# Patient Record
Sex: Female | Born: 1985 | Race: Black or African American | Hispanic: No | Marital: Single | Smoking: Former smoker
Health system: Southern US, Community
[De-identification: ages and names within clinical notes are randomized; demographics above are authoritative.]

## PROBLEM LIST (undated history)

## (undated) DIAGNOSIS — D649 Anemia, unspecified: Secondary | ICD-10-CM

## (undated) HISTORY — PX: LEEP: SHX91

---

## 2008-02-04 ENCOUNTER — Ambulatory Visit: Payer: Self-pay | Admitting: Unknown Physician Specialty

## 2010-08-04 ENCOUNTER — Emergency Department (HOSPITAL_BASED_OUTPATIENT_CLINIC_OR_DEPARTMENT_OTHER): Admission: EM | Admit: 2010-08-04 | Discharge: 2010-08-04 | Payer: Self-pay | Admitting: Emergency Medicine

## 2013-09-02 NOTE — L&D Delivery Note (Signed)
0230: In room to assess patient after noting variable decelerations with contractions.  Patient breathing and coping well through contractions.  SVE: 8-9/100/+2.  Patient quickly progressed to complete dilation.  Pushing spontaneously with contractions.  Encouraged by staff and FOB.  Patient delivered as below with NICU team present for IUGR.  Delivery Note At 2:50 AM, on Aug 03, 2014, a viable female "Darlene Shepard"  was delivered via Vaginal, Spontaneous Delivery (Presentation: ; Left Occiput Transverse with restitution to Direct OP).  Infant noted to have nuchal cord, after delivery of head, which she was delivered through via somersault maneuver.  Infant with vigorous cry and good tone and immediately placed upon mother's abdomen. Cord clamped and cut, then infant taken by nurse to warmer for NICU team evaluation.  Infant APGARs: 8, 9.  Cord blood collected. Placenta delivered spontaneously and noted to be intact with 3VC upon inspection. Sent to pathology due to IUGR.  Vaginal inspection revealed no lacerations.  Fundus firm, at the umbilicus, and bleeding scant.  Mother hemodynamically stable and infant skin to skin prior to provider exit.  Mother unsure of birth control method and opts to breastfeed.  Infant weight at one hour of life: 5 lb 11.4 oz (2590 g).  Anesthesia: None  Episiotomy: None Lacerations: None Suture Repair: N/A Est. Blood Loss (mL): 100  Mom to postpartum.  Baby to Couplet care / Skin to Skin.  Tafari Humiston Larita FifeLYNN, MSN, CNM 08/03/2014, 3:16 AM

## 2013-12-07 LAB — OB RESULTS CONSOLE RUBELLA ANTIBODY, IGM: RUBELLA: IMMUNE

## 2013-12-07 LAB — OB RESULTS CONSOLE GC/CHLAMYDIA
CHLAMYDIA, DNA PROBE: NEGATIVE
GC PROBE AMP, GENITAL: NEGATIVE

## 2013-12-07 LAB — OB RESULTS CONSOLE HIV ANTIBODY (ROUTINE TESTING): HIV: NONREACTIVE

## 2013-12-07 LAB — OB RESULTS CONSOLE ABO/RH: RH Type: POSITIVE

## 2013-12-07 LAB — OB RESULTS CONSOLE HEPATITIS B SURFACE ANTIGEN: Hepatitis B Surface Ag: NEGATIVE

## 2013-12-07 LAB — OB RESULTS CONSOLE ANTIBODY SCREEN: ANTIBODY SCREEN: NEGATIVE

## 2013-12-07 LAB — OB RESULTS CONSOLE RPR: RPR: NONREACTIVE

## 2014-01-13 ENCOUNTER — Other Ambulatory Visit: Payer: Self-pay

## 2014-03-18 ENCOUNTER — Other Ambulatory Visit (HOSPITAL_COMMUNITY): Payer: Self-pay | Admitting: Obstetrics and Gynecology

## 2014-03-18 DIAGNOSIS — IMO0002 Reserved for concepts with insufficient information to code with codable children: Secondary | ICD-10-CM

## 2014-03-29 ENCOUNTER — Ambulatory Visit (HOSPITAL_COMMUNITY)
Admission: RE | Admit: 2014-03-29 | Discharge: 2014-03-29 | Disposition: A | Discharge status: Home / Self Care | Payer: 59 | Source: Ambulatory Visit | Attending: Obstetrics and Gynecology | Admitting: Obstetrics and Gynecology

## 2014-03-29 ENCOUNTER — Encounter (HOSPITAL_COMMUNITY): Payer: Self-pay

## 2014-03-29 ENCOUNTER — Other Ambulatory Visit (HOSPITAL_COMMUNITY): Payer: Self-pay | Admitting: Obstetrics and Gynecology

## 2014-03-29 VITALS — BP 119/83 | HR 77 | Wt 156.2 lb

## 2014-03-29 DIAGNOSIS — IMO0002 Reserved for concepts with insufficient information to code with codable children: Secondary | ICD-10-CM

## 2014-03-29 DIAGNOSIS — Z3689 Encounter for other specified antenatal screening: Secondary | ICD-10-CM | POA: Insufficient documentation

## 2014-03-29 DIAGNOSIS — O365990 Maternal care for other known or suspected poor fetal growth, unspecified trimester, not applicable or unspecified: Secondary | ICD-10-CM | POA: Diagnosis not present

## 2014-03-29 NOTE — Consult Note (Signed)
MFM consult  28 yr old G3P1011 at 48w3dby LMP consistent with 12 week ultrasound referred for fetal anatomic survey and consult. Fetus with lagging growth on outside ultrasound.  Ultrasound today shows: single intrauterine pregnancy. Estimated fetal weight is in the 15th%; the abdominal circumference is in the <3rd%. Anterior placenta without evidence of previa. Normal amniotic fluid volume. Normal transabdominal cervical length. The views of the 4 chamber and aortic and ductal arches are limited. There is right sided fetal urinary tract dilation (class I); the right renal pelvis measures 4.738m the left renal pelvis, ureters, and bladder appear normal. The remainder of the limited anatomy survey is normal.   I counseled the patient as follows:  1. Lagging fetal growth: - dated by LMP consistent with 12 week ultrasound - discussed possible etiologies- placental insufficiency, aneuploidy, other genetic conditions, infections, constitutional (patient's first child was 6lbs 10oz at 41 weeks) - discussed it is reassuring that the amniotic fluid volume and Doppler studies are normal today - discussed increased risk of stillbirth, needing hospitalization, and possible need for preterm delivery - would recommend a course of betamethasone if Doppler studies become abnormal or other clinical indication - recommend weekly Doppler studies - recommend fetal growth in 3 weeks 2. Urinary tract dilation: discussed slight association with fetal aneuploidy; specifically trisomy 21. . Marland Kitcheniscussed the etiology could be benign finding or variant of normal- this is most likely given the minimal dilation. Pyelectasis can also be caused by obstruction or vesicoureteral reflux. I discussed that the majority of the cases resolve antepartum or shortly after delivery. A small percentage of cases may require prophylactic antibiotics, renal ultrasound, and VCUG, or surgical correction. Will reevaluate fetal kidneys on follow up  ultrasounds 3. Patient met with the genetic counselor; see separate report - after counseling patient declined aneuploidy screening/testing   I spent a total of 45 minutes with the patient of which >50% was in face to face consultation.  KrElam CityMD

## 2014-03-29 NOTE — Progress Notes (Signed)
Genetic Counseling  High-Risk Gestation Note  Appointment Date:  03/29/2014 Referred By: Kirkland Hun, MD Date of Birth:  July 19, 1986    Pregnancy History: G3P0011 Estimated Date of Delivery: 07/23/14 Estimated Gestational Age: [redacted]w[redacted]d Attending: Eulis Foster, MD  Darlene Shepard and her partner were seen for genetic counseling because of abnormal ultrasound findings.    Targeted ultrasound was performed today and visualized lagging fetal growth and right sided urinary tract dilation (pyelectasis) measuring 4.7 mm. Complete ultrasound results reported separately.   We discussed that the second trimester genetic sonogram is targeted at identifying features associated with aneuploidy.  It has evolved as a screening tool used to provide an individualized risk assessment for Down syndrome and other trisomies.  The ability of sonography to aid in the detection of aneuploidies relies on identification of both major structural anomalies and "soft markers."  The patient was counseled that the latter term refers to findings that are often normal variants and do not cause any significant medical problems.  Nonetheless, these markers have a known association with aneuploidy.    We discussed that urinary tract dilation (also referred to as pyelectasis) is identified when the antero-posterior diameter of either renal pelvis exceeds > 4 mm between 20 and 30 weeks.  We reviewed that this marker is more common in males and is considered a benign variant in fetuses who are determined to have no other risk factors for fetal aneuploidy.  Since this finding can progress to congenital hydronephrosis, follow up ultrasounds are recommended.  Ms. Facchini is scheduled to return for a follow-up ultrasound to assess fetal growth on 04/19/14. Weekly dopplers were planned given the lagging growth identified on ultrasound.   We reviewed chromosomes, nondisjunction, and the common features and variable prognosis  of Down syndrome.  We also reviewed other aneuploidies including trisomies 13 and 14; however, we discussed that these conditions are less likely given that the remainder of the fetal anatomy was wnl by ultrasound.  We reviewed that Ms. Walsh had first trimester screening, which were within normal range, reducing the risks for fetal Down syndrome (< 1 in 10,000) and trisomy 18/13 (< 1 in 10,000). We reviewed that screening adjusts the a priori risk for these conditions but does not diagnose or rule out these conditions. Additionally, screening does not assess for all chromosome or genetic conditions. Considering the combined ultrasound finding of pyelectasis and lagging fetal growth, the risk for fetal aneuploidy would be increased. We discussed that the adjusted risk for fetal Down syndrome would be likely less than 1 in 1,000.   Regarding lagging fetal growth, the patient reported that her son, with a previous partner, was 6 lbs 10 oz at birth and was a term delivery. We reviewed that there can be multiple causes for fetal growth lag including fetal aneuploidy, less common genetic conditions, placental etiology, and constitutional. See separate ultrasound report for additional discussion.    We reviewed other available screening and diagnostic options including noninvasive prenatal screening (NIPS)/cell free DNA (cfDNA) testing, and amniocentesis.  She was counseled regarding the benefits and limitations of each option.  We reviewed the approximate 1 in 300-500 risk for complications for amniocentesis, including spontaneous pregnancy loss or preterm labor at this gestational age. After consideration of all the options, she declined NIPS and amniocentesis at this time.   Both family histories were reviewed and found to be noncontributory for birth defects, intellectual disability, and known genetic conditions. Without further information regarding the provided family history, an  accurate genetic risk  cannot be calculated. Further genetic counseling is warranted if more information is obtained.  Ms. Tesslyn T Zinda denied exposure to environmental toxins or chemical agents. She denied the use of alcohol, tobacco or street drugs. She denied significant viral illnesses during the course of her pregnancy. Her medical and surgical histories were noncontributory.   I counseled this couple regarding the above risks and available options.  The approximate face-to-face time with the genetic counselor was 20 minutes.  Quinn PlowmanKaren Banks Chaikin, MS Certified Genetic Counselor 03/29/2014

## 2014-04-05 ENCOUNTER — Ambulatory Visit (HOSPITAL_COMMUNITY)
Admission: RE | Admit: 2014-04-05 | Discharge: 2014-04-05 | Disposition: A | Discharge status: Home / Self Care | Payer: 59 | Source: Ambulatory Visit | Attending: Obstetrics and Gynecology | Admitting: Obstetrics and Gynecology

## 2014-04-05 DIAGNOSIS — IMO0002 Reserved for concepts with insufficient information to code with codable children: Secondary | ICD-10-CM

## 2014-04-05 DIAGNOSIS — O365990 Maternal care for other known or suspected poor fetal growth, unspecified trimester, not applicable or unspecified: Secondary | ICD-10-CM | POA: Diagnosis present

## 2014-04-05 DIAGNOSIS — Z3689 Encounter for other specified antenatal screening: Secondary | ICD-10-CM | POA: Diagnosis not present

## 2014-04-12 ENCOUNTER — Encounter (HOSPITAL_COMMUNITY): Payer: Self-pay

## 2014-04-12 ENCOUNTER — Ambulatory Visit (HOSPITAL_COMMUNITY)
Admission: RE | Admit: 2014-04-12 | Discharge: 2014-04-12 | Disposition: A | Discharge status: Home / Self Care | Payer: 59 | Source: Ambulatory Visit | Attending: Obstetrics and Gynecology | Admitting: Obstetrics and Gynecology

## 2014-04-12 VITALS — BP 115/55 | HR 85 | Wt 162.2 lb

## 2014-04-12 DIAGNOSIS — Z3689 Encounter for other specified antenatal screening: Secondary | ICD-10-CM | POA: Insufficient documentation

## 2014-04-12 DIAGNOSIS — IMO0002 Reserved for concepts with insufficient information to code with codable children: Secondary | ICD-10-CM

## 2014-04-12 DIAGNOSIS — O365920 Maternal care for other known or suspected poor fetal growth, second trimester, not applicable or unspecified: Secondary | ICD-10-CM

## 2014-04-12 DIAGNOSIS — O365990 Maternal care for other known or suspected poor fetal growth, unspecified trimester, not applicable or unspecified: Secondary | ICD-10-CM | POA: Diagnosis present

## 2014-04-12 DIAGNOSIS — O358XX0 Maternal care for other (suspected) fetal abnormality and damage, not applicable or unspecified: Secondary | ICD-10-CM | POA: Insufficient documentation

## 2014-04-19 ENCOUNTER — Encounter (HOSPITAL_COMMUNITY): Payer: Self-pay

## 2014-04-19 ENCOUNTER — Ambulatory Visit (HOSPITAL_COMMUNITY)
Admission: RE | Admit: 2014-04-19 | Discharge: 2014-04-19 | Disposition: A | Discharge status: Home / Self Care | Payer: 59 | Source: Ambulatory Visit | Attending: Obstetrics and Gynecology | Admitting: Obstetrics and Gynecology

## 2014-04-19 VITALS — BP 116/64 | HR 89 | Wt 166.0 lb

## 2014-04-19 DIAGNOSIS — O365920 Maternal care for other known or suspected poor fetal growth, second trimester, not applicable or unspecified: Secondary | ICD-10-CM

## 2014-04-19 DIAGNOSIS — O365990 Maternal care for other known or suspected poor fetal growth, unspecified trimester, not applicable or unspecified: Secondary | ICD-10-CM | POA: Insufficient documentation

## 2014-04-19 DIAGNOSIS — Z3689 Encounter for other specified antenatal screening: Secondary | ICD-10-CM | POA: Insufficient documentation

## 2014-04-19 DIAGNOSIS — IMO0002 Reserved for concepts with insufficient information to code with codable children: Secondary | ICD-10-CM

## 2014-05-10 ENCOUNTER — Ambulatory Visit (HOSPITAL_COMMUNITY): Payer: 59 | Attending: Obstetrics and Gynecology

## 2014-05-20 ENCOUNTER — Other Ambulatory Visit (HOSPITAL_COMMUNITY): Payer: Self-pay | Admitting: Obstetrics and Gynecology

## 2014-05-20 DIAGNOSIS — O365931 Maternal care for other known or suspected poor fetal growth, third trimester, fetus 1: Secondary | ICD-10-CM

## 2014-05-20 DIAGNOSIS — O358XX0 Maternal care for other (suspected) fetal abnormality and damage, not applicable or unspecified: Secondary | ICD-10-CM

## 2014-05-25 ENCOUNTER — Ambulatory Visit (HOSPITAL_COMMUNITY)
Admission: RE | Admit: 2014-05-25 | Discharge: 2014-05-25 | Disposition: A | Discharge status: Home / Self Care | Payer: 59 | Source: Ambulatory Visit | Attending: Obstetrics and Gynecology | Admitting: Obstetrics and Gynecology

## 2014-05-25 ENCOUNTER — Other Ambulatory Visit (HOSPITAL_COMMUNITY): Payer: Self-pay | Admitting: Obstetrics and Gynecology

## 2014-05-25 VITALS — BP 118/64 | HR 84 | Wt 171.5 lb

## 2014-05-25 DIAGNOSIS — O365990 Maternal care for other known or suspected poor fetal growth, unspecified trimester, not applicable or unspecified: Secondary | ICD-10-CM | POA: Diagnosis not present

## 2014-05-25 DIAGNOSIS — O365931 Maternal care for other known or suspected poor fetal growth, third trimester, fetus 1: Secondary | ICD-10-CM

## 2014-05-25 DIAGNOSIS — O358XX0 Maternal care for other (suspected) fetal abnormality and damage, not applicable or unspecified: Secondary | ICD-10-CM | POA: Diagnosis present

## 2014-05-25 DIAGNOSIS — O365930 Maternal care for other known or suspected poor fetal growth, third trimester, not applicable or unspecified: Secondary | ICD-10-CM

## 2014-07-04 ENCOUNTER — Encounter (HOSPITAL_COMMUNITY): Payer: Self-pay

## 2014-07-28 ENCOUNTER — Inpatient Hospital Stay (HOSPITAL_COMMUNITY): Payer: 59

## 2014-07-28 ENCOUNTER — Encounter (HOSPITAL_COMMUNITY): Payer: Self-pay | Admitting: *Deleted

## 2014-07-28 ENCOUNTER — Inpatient Hospital Stay (HOSPITAL_COMMUNITY)
Admission: AD | Admit: 2014-07-28 | Discharge: 2014-07-29 | Disposition: A | Discharge status: Home / Self Care | Payer: 59 | Source: Ambulatory Visit | Attending: Obstetrics and Gynecology | Admitting: Obstetrics and Gynecology

## 2014-07-28 DIAGNOSIS — O368130 Decreased fetal movements, third trimester, not applicable or unspecified: Secondary | ICD-10-CM | POA: Insufficient documentation

## 2014-07-28 DIAGNOSIS — Z3A40 40 weeks gestation of pregnancy: Secondary | ICD-10-CM | POA: Diagnosis not present

## 2014-07-28 DIAGNOSIS — O480 Post-term pregnancy: Secondary | ICD-10-CM | POA: Insufficient documentation

## 2014-07-28 DIAGNOSIS — O368190 Decreased fetal movements, unspecified trimester, not applicable or unspecified: Secondary | ICD-10-CM

## 2014-07-28 NOTE — MAU Note (Signed)
Pt states she felt like she has been having decreased movement from this morning and does not feel like baby was moving the way the baby had been moving

## 2014-07-28 NOTE — MAU Provider Note (Signed)
History    Darlene Shepard is a 28 y.o G3P1011 at 40.5wks who presents for decreased fetal movement.  Patient states she has not felt baby move all day, but has felt movement since arrival at the hospital.  Patient denies LOF, VB, and perception of contractions.  Patient also denies issues with urination and bowel movements.   Patient Active Problem List   Diagnosis Date Noted  . Poor fetal growth, affecting management of mother, antepartum condition or complication 04/12/2014  . Other known or suspected fetal abnormality, not elsewhere classified, affecting management of mother, antepartum condition or complication 04/12/2014    Chief Complaint  Patient presents with  . Decreased Fetal Movement   HPI  OB History    Gravida Para Term Preterm AB TAB SAB Ectopic Multiple Living   3 1 1  0 1 0 1 0 0 1      History reviewed. No pertinent past medical history.  Past Surgical History  Procedure Laterality Date  . Leep      Family History  Problem Relation Age of Onset  . Hypertension Father     History  Substance Use Topics  . Smoking status: Not on file  . Smokeless tobacco: Not on file  . Alcohol Use: Not on file    Allergies: No Known Allergies  Prescriptions prior to admission  Medication Sig Dispense Refill Last Dose  . calcium carbonate (TUMS - DOSED IN MG ELEMENTAL CALCIUM) 500 MG chewable tablet Chew 1-4 tablets by mouth daily as needed for heartburn.   07/27/2014 at Unknown time  . Prenatal Vit-Fe Fumarate-FA (PRENATAL MULTIVITAMIN) TABS tablet Take 1 tablet by mouth daily at 12 noon.   Past Week at Unknown time    ROS  See HPI Above Physical Exam   Height 5\' 3"  (1.6 m), weight 180 lb (81.647 kg), last menstrual period 10/16/2013.  Physical Exam  Constitutional: She is oriented to person, place, and time. She appears well-developed and well-nourished. No distress.  Cardiovascular: Normal rate, regular rhythm and normal heart sounds.   Respiratory:  Effort normal and breath sounds normal.  GI: Soft. Bowel sounds are normal.  Appears gravid--fundal height AGA, Soft, NT  Genitourinary:  Deferred  Musculoskeletal: Normal range of motion.  Neurological: She is alert and oriented to person, place, and time.  Skin: Skin is warm and dry.   FHR: 150 bpm, Mod Var, -Decels, -Accels UC: Irregular SVE:Cl/50/-2/ Posterior ED Course  Assessment: IUP at 40.5wks Cat I FT Decreased FM  Plan: -Await for reactive NST -If not reactive after 40 minutes, position change, and hydration order BPP -Discussed BPP with patient and FOB, questions and concerns addressed  Follow Up (2215) -BPP 8/8---Cephalic, AFI WNL, FHR 160s -Patient questions if she can return at a later date for IOL, does not desire IOL tonight. -Informed that she needs to follow up in office and schedule accordingly.   -Labor Precautions discussed -Keep appt as scheduled -Encouraged to call if any questions or concerns arise prior to next scheduled office visit.  -Discharged to home in stable condition  Orlo Brickle LYNN CNM, MSN 07/28/2014 9:45 PM

## 2014-07-28 NOTE — Discharge Instructions (Signed)

## 2014-07-29 DIAGNOSIS — O368130 Decreased fetal movements, third trimester, not applicable or unspecified: Secondary | ICD-10-CM | POA: Diagnosis not present

## 2014-08-03 ENCOUNTER — Inpatient Hospital Stay (HOSPITAL_COMMUNITY)
Admission: AD | Admit: 2014-08-03 | Discharge: 2014-08-05 | DRG: 775 | Disposition: A | Discharge status: Home / Self Care | Payer: 59 | Source: Ambulatory Visit | Attending: Obstetrics and Gynecology | Admitting: Obstetrics and Gynecology

## 2014-08-03 ENCOUNTER — Encounter (HOSPITAL_COMMUNITY): Payer: Self-pay

## 2014-08-03 DIAGNOSIS — O358XX0 Maternal care for other (suspected) fetal abnormality and damage, not applicable or unspecified: Secondary | ICD-10-CM | POA: Diagnosis present

## 2014-08-03 DIAGNOSIS — O365930 Maternal care for other known or suspected poor fetal growth, third trimester, not applicable or unspecified: Secondary | ICD-10-CM | POA: Diagnosis present

## 2014-08-03 DIAGNOSIS — O289 Unspecified abnormal findings on antenatal screening of mother: Secondary | ICD-10-CM | POA: Diagnosis present

## 2014-08-03 DIAGNOSIS — Z8249 Family history of ischemic heart disease and other diseases of the circulatory system: Secondary | ICD-10-CM

## 2014-08-03 DIAGNOSIS — Z3A41 41 weeks gestation of pregnancy: Secondary | ICD-10-CM | POA: Diagnosis present

## 2014-08-03 DIAGNOSIS — IMO0001 Reserved for inherently not codable concepts without codable children: Secondary | ICD-10-CM

## 2014-08-03 DIAGNOSIS — Z87891 Personal history of nicotine dependence: Secondary | ICD-10-CM

## 2014-08-03 DIAGNOSIS — IMO0002 Reserved for concepts with insufficient information to code with codable children: Secondary | ICD-10-CM | POA: Diagnosis present

## 2014-08-03 DIAGNOSIS — Z9119 Patient's noncompliance with other medical treatment and regimen: Secondary | ICD-10-CM

## 2014-08-03 DIAGNOSIS — O09899 Supervision of other high risk pregnancies, unspecified trimester: Secondary | ICD-10-CM

## 2014-08-03 DIAGNOSIS — O480 Post-term pregnancy: Principal | ICD-10-CM | POA: Diagnosis present

## 2014-08-03 HISTORY — DX: Anemia, unspecified: D64.9

## 2014-08-03 LAB — CBC
HEMATOCRIT: 33.5 % — AB (ref 36.0–46.0)
Hemoglobin: 11.3 g/dL — ABNORMAL LOW (ref 12.0–15.0)
MCH: 30.2 pg (ref 26.0–34.0)
MCHC: 33.7 g/dL (ref 30.0–36.0)
MCV: 89.6 fL (ref 78.0–100.0)
Platelets: 246 10*3/uL (ref 150–400)
RBC: 3.74 MIL/uL — ABNORMAL LOW (ref 3.87–5.11)
RDW: 12.5 % (ref 11.5–15.5)
WBC: 12.3 10*3/uL — ABNORMAL HIGH (ref 4.0–10.5)

## 2014-08-03 LAB — ABO/RH: ABO/RH(D): B POS

## 2014-08-03 LAB — TYPE AND SCREEN
ABO/RH(D): B POS
Antibody Screen: NEGATIVE

## 2014-08-03 LAB — HIV ANTIBODY (ROUTINE TESTING W REFLEX): HIV 1&2 Ab, 4th Generation: NONREACTIVE

## 2014-08-03 LAB — RPR

## 2014-08-03 MED ORDER — DIPHENHYDRAMINE HCL 25 MG PO CAPS: 25.0000 mg | ORAL_CAPSULE | Freq: Four times a day (QID) | ORAL | Status: DC | PRN

## 2014-08-03 MED ORDER — ONDANSETRON HCL 4 MG/2ML IJ SOLN: 4.0000 mg | Freq: Four times a day (QID) | INTRAMUSCULAR | Status: DC | PRN

## 2014-08-03 MED ORDER — PRENATAL MULTIVITAMIN CH
1.0000 | ORAL_TABLET | Freq: Every day | ORAL | Status: DC
  Administered 2014-08-03 – 2014-08-04 (×2): 1 via ORAL
  Filled 2014-08-03 (×3): qty 1

## 2014-08-03 MED ORDER — IBUPROFEN 600 MG PO TABS
600.0000 mg | ORAL_TABLET | Freq: Four times a day (QID) | ORAL | Status: DC
  Administered 2014-08-03 – 2014-08-05 (×7): 600 mg via ORAL
  Filled 2014-08-03 (×10): qty 1

## 2014-08-03 MED ORDER — BENZOCAINE-MENTHOL 20-0.5 % EX AERO: 1.0000 "application " | INHALATION_SPRAY | CUTANEOUS | Status: DC | PRN

## 2014-08-03 MED ORDER — LACTATED RINGERS IV SOLN: 500.0000 mL | INTRAVENOUS | Status: DC | PRN

## 2014-08-03 MED ORDER — ZOLPIDEM TARTRATE 5 MG PO TABS
5.0000 mg | ORAL_TABLET | Freq: Every evening | ORAL | Status: DC | PRN
Start: 2014-08-03 — End: 2014-08-05

## 2014-08-03 MED ORDER — OXYTOCIN 40 UNITS IN LACTATED RINGERS INFUSION - SIMPLE MED
62.5000 mL/h | INTRAVENOUS | Status: DC
  Administered 2014-08-03: 62.5 mL/h via INTRAVENOUS
  Filled 2014-08-03: qty 1000

## 2014-08-03 MED ORDER — DIBUCAINE 1 % RE OINT
1.0000 | TOPICAL_OINTMENT | RECTAL | Status: DC | PRN
Start: 2014-08-03 — End: 2014-08-05

## 2014-08-03 MED ORDER — OXYCODONE-ACETAMINOPHEN 5-325 MG PO TABS: 1.0000 | ORAL_TABLET | ORAL | Status: DC | PRN

## 2014-08-03 MED ORDER — OXYCODONE-ACETAMINOPHEN 5-325 MG PO TABS: 2.0000 | ORAL_TABLET | ORAL | Status: DC | PRN

## 2014-08-03 MED ORDER — SIMETHICONE 80 MG PO CHEW: 80.0000 mg | CHEWABLE_TABLET | ORAL | Status: DC | PRN

## 2014-08-03 MED ORDER — ONDANSETRON HCL 4 MG/2ML IJ SOLN: 4.0000 mg | INTRAMUSCULAR | Status: DC | PRN

## 2014-08-03 MED ORDER — LIDOCAINE HCL (PF) 1 % IJ SOLN
30.0000 mL | INTRAMUSCULAR | Status: DC | PRN
  Filled 2014-08-03: qty 30

## 2014-08-03 MED ORDER — ONDANSETRON HCL 4 MG PO TABS: 4.0000 mg | ORAL_TABLET | ORAL | Status: DC | PRN

## 2014-08-03 MED ORDER — SENNOSIDES-DOCUSATE SODIUM 8.6-50 MG PO TABS
2.0000 | ORAL_TABLET | ORAL | Status: DC
  Administered 2014-08-03 – 2014-08-05 (×2): 2 via ORAL
  Filled 2014-08-03 (×2): qty 2

## 2014-08-03 MED ORDER — NALBUPHINE HCL 10 MG/ML IJ SOLN: 10.0000 mg | INTRAMUSCULAR | Status: DC | PRN

## 2014-08-03 MED ORDER — ACETAMINOPHEN 325 MG PO TABS: 650.0000 mg | ORAL_TABLET | ORAL | Status: DC | PRN

## 2014-08-03 MED ORDER — LANOLIN HYDROUS EX OINT: TOPICAL_OINTMENT | CUTANEOUS | Status: DC | PRN

## 2014-08-03 MED ORDER — LACTATED RINGERS IV SOLN
INTRAVENOUS | Status: DC
  Administered 2014-08-03: 02:00:00 via INTRAVENOUS

## 2014-08-03 MED ORDER — OXYTOCIN BOLUS FROM INFUSION
500.0000 mL | INTRAVENOUS | Status: DC
  Administered 2014-08-03: 500 mL via INTRAVENOUS

## 2014-08-03 MED ORDER — WITCH HAZEL-GLYCERIN EX PADS: 1.0000 "application " | MEDICATED_PAD | CUTANEOUS | Status: DC | PRN

## 2014-08-03 MED ORDER — CITRIC ACID-SODIUM CITRATE 334-500 MG/5ML PO SOLN: 30.0000 mL | ORAL | Status: DC | PRN

## 2014-08-03 MED ORDER — TETANUS-DIPHTH-ACELL PERTUSSIS 5-2.5-18.5 LF-MCG/0.5 IM SUSP: 0.5000 mL | Freq: Once | INTRAMUSCULAR | Status: DC

## 2014-08-03 NOTE — Plan of Care (Signed)
Problem: Consults Goal: Postpartum Patient Education (See Patient Education module for education specifics.) Outcome: Completed/Met Date Met:  08/03/14

## 2014-08-03 NOTE — Plan of Care (Signed)
Problem: Phase I Progression Outcomes Goal: Pain controlled with appropriate interventions Outcome: Progressing Goal: Voiding adequately Outcome: Completed/Met Date Met:  08/03/14 Goal: Foley catheter patent Outcome: Not Applicable Date Met:  36/14/43 Goal: OOB as tolerated unless otherwise ordered Outcome: Completed/Met Date Met:  08/03/14 Goal: IS, TCDB as ordered Outcome: Not Applicable Date Met:  15/40/08 Goal: VS, stable, temp < 100.4 degrees F Outcome: Progressing Goal: Initial discharge plan identified Outcome: Completed/Met Date Met:  08/03/14 Goal: Other Phase I Outcomes/Goals Outcome: Not Applicable Date Met:  67/61/95  Problem: Phase II Progression Outcomes Goal: Pain controlled on oral analgesia Outcome: Progressing Goal: Progress activity as tolerated unless otherwise ordered Outcome: Progressing Goal: Afebrile, VS remain stable Outcome: Progressing Goal: Incision intact & without signs/symptoms of infection Outcome: Not Applicable Date Met:  09/32/67 Goal: Rh isoimmunization per orders Outcome: Not Applicable Date Met:  12/45/80 Goal: Tolerating diet Outcome: Completed/Met Date Met:  08/03/14 Goal: Other Phase II Outcomes/Goals Outcome: Not Applicable Date Met:  99/83/38

## 2014-08-03 NOTE — Lactation Note (Signed)
This note was copied from the chart of Darlene Shepard. Lactation Consultation Note  Patient Name: Darlene Cherly Hensenntwanisha Pascal JYNWG'NToday's Date: 08/03/2014 Reason for consult: Initial assessment Baby 19 hours of life. Mom nursing when [facility]. Baby suckling rhythmically with with lips flanged and a few swallows noted. Enc mom to continue to nurse with cues and to nurse at least every 3 hours. Discussed cluster-feeding with parents, and nursing/pumping when returning to work. Mom states that she is able to hand express colostrum easily. Mom given [facility] brochure, aware of OP/BFSG, community resources, and [facility] phone line assistance after D/C. Enc mom to call for assistance with nursing/latching as needed.   Maternal Data Has patient been taught Hand Expression?: Yes Does the patient have breastfeeding experience prior to this delivery?: Yes  Feeding Feeding Type: Breast Fed Length of feed: 15 min  LATCH Score/Interventions                      Lactation Tools Discussed/Used     Consult Status Consult Status: Follow-up Date: 08/04/14 Follow-up type: In-patient    Geralynn OchsWILLIARD, Ki Luckman 08/03/2014, 10:39 PM

## 2014-08-03 NOTE — Progress Notes (Signed)
Darlene Shepard   Subjective: Post Partum Day 0 Vaginal delivery, no laceration Patient up ad lib, denies syncope or dizziness. Reports consuming regular diet without issues and denies N/V No issues with urination and reports bleeding is appropriate  Feeding:  breast Contraceptive plan:   unsure  Objective: Temp:  [98.2 F (36.8 C)-99.2 F (37.3 C)] 98.2 F (36.8 C) (12/02 0515) Pulse Rate:  [65-89] 77 (12/02 0515) Resp:  [16-20] 18 (12/02 0515) BP: (91-129)/(45-79) 115/60 mmHg (12/02 0515) SpO2:  [95 %-98 %] 95 % (12/02 0515) Weight:  [184 lb (83.462 kg)] 184 lb (83.462 kg) (12/02 0157)  Physical Exam:  General: alert and cooperative Ext: WNL, no edema. No evidence of DVT seen on physical exam. Breast: Soft filling Lungs: CTAB Heart RRR without murmur  Abdomen:  Soft, fundus firm, lochia scant, + bowel sounds, non distended, non tender Lochia: appropriate Uterine Fundus: firm Laceration: n/a    Recent Labs  08/03/14 0100  HGB 11.3*  HCT 33.5*    Assessment S/P Vaginal Delivery-Day 0 Stable  Normal Involution Breastfeeding   Plan: Continue current care Breastfeeding Lactation support   Afreen Siebels, CNM, MSN 08/03/2014, 8:15 AM

## 2014-08-03 NOTE — Plan of Care (Signed)
Problem: Phase I Progression Outcomes Goal: Pain controlled with appropriate interventions Outcome: Completed/Met Date Met:  08/03/14 Goal: VS, stable, temp < 100.4 degrees F Outcome: Completed/Met Date Met:  08/03/14

## 2014-08-03 NOTE — MAU Note (Signed)
Having contractions.  No leaking.  No bleeding.

## 2014-08-03 NOTE — H&P (Signed)
Darlene Shepard is a 28 y.o. female, G3P1011 at 41.4 weeks, presenting for active labor.  Patient reports she has been contracting all day, but frequency and intensity increased about 2 hours prior to arrival.  Reports active fetus and denies LOF and VB.  Patient desires low intervention birth, but was noted to have IUGR at 32wks with elevated/abnormal UAD at 38wks.  Patient has since been declining IOL.  GBS negative.    Patient Active Problem List   Diagnosis Date Noted  . Poor fetal growth, affecting management of mother, antepartum condition or complication 04/12/2014  . Other known or suspected fetal abnormality, not elsewhere classified, affecting management of mother, antepartum condition or complication 04/12/2014    History of present pregnancy: Patient entered care at 9.5 weeks.   EDC of 07/23/2014 was established by Definite LMP of 10/16/2013.   Anatomy scan:  19.3 weeks, with limited findings and an anterior placenta.   Additional US evaluations:   -Anatomy U/S reviewed: EFW 90z (12.3%), HC (small for dates=5.8%), need f/u for cardiac views.    -21.6wks: F/U Anatomy--EFW 13 OZ, 4.7% WITH HC<2.3%, BPD 5.7%, AC 3.2%, FL 24.3%. BREECH. NORMAL AMNIOTIC FLUID POCKET, UMBILICAL ARTERY DOPPLERS WITHIN NORMAL LIMITS. RVOT NOT WELL VISUALIZED. BREECH. ANTERIOR PLACENTA. AS US SHOWS PERSISTENT LAGGING IN HEAD MEASUREMENTS AND NOW ALSO WITH LAGGING IN EFW -26.3wks: MFM US: EFW 1LB 11 OZ (23%)RIGHT SIDED PYELECTASIS 4MM. -31.4wks: EFW 3 LB (16TH%), NORMAL KIDNEYS, BUT HC, FL, AC IN 5TH PERCENTILE -33.3wks: AFI WNL AND NORMAL DOPPLERS. BPP 10/10 -35.3wks: Growth US 1957 grams AFI=15.67, dopplers WNL -37.5wks: Single gestation, vertex, normal fluid, growth is less than the 3rd percentile, biophysical profile is 8 out of 8, normal Doppler studies, weight equals 2184 g (1957 g on last visit).  -38.5wks: Elevated UAD. EFW less than 2% 2437 grams  -39.5wks: BPP 4/10. Non reactive NST. UAD  greater than 95%. AFI-16.2 Significant prenatal events:  Common pregnancy complaints including N/V, back pain, IUGR, Pt. declined Amniocentesis despite MFM recommendation.  BID weekly NST, weekly AFI with UAD started at 32wks.  Refused IOL recommendation at 37.5wks after US as above.  Refused IOL at 38.2wks and to sign AMA consent form. Refused IOL at 38.5 and 39.5wks Last evaluation:  07/21/2014 by S. Devane-Johnson, CNM--FT/25/-3---BP 110/60  Wt: 183lbs Also seen in MAU on 07/28/2014 for DFM. BPP 8/8  OB History    Gravida Para Term Preterm AB TAB SAB Ectopic Multiple Living   3 1 1  0 1 0 1 0 0 1    10/2010-SAB 09/2011-6.10lb Female at 41wks via SVD with Epidural   Past Medical History  Diagnosis Date  . Anemia    Past Surgical History  Procedure Laterality Date  . Leep     Family History: family history includes Hypertension in her father. Social History:  reports that she has quit smoking. She has never used smokeless tobacco. She reports that she does not drink alcohol or use illicit drugs.  Patient is a single African American with 2 year college education.  Currently employed as Acupuncturistdietary service manager.  FOB present and involved.  Prenatal Transfer Tool  Maternal Diabetes: No Genetic Screening: Normal Maternal Ultrasounds/Referrals: Abnormal:  Findings:   IUGR, Fetal renal pyelectasis Fetal Ultrasounds or other Referrals:  Referred to Materal Fetal Medicine  Maternal Substance Abuse:  No Significant Maternal Medications:  None Significant Maternal Lab Results: Lab values include: Group B Strep negative    ROS:  +Ctx, +FM, -LOF, -VB  No Known  Allergies     Blood pressure 127/79, pulse 86, temperature 99.2 F (37.3 C), temperature source Oral, resp. rate 18, last menstrual period 10/16/2013.  Chest clear Heart RRR without murmur Abd gravid, NT Pelvic: Proven to 6lbs Ext: WNL  FHR: 135 bpm, Mod Var, -Decels, -Accels UCs:  Q2-274min, palpates mild  Prenatal  labs: ABO, Rh: B/Positive/-- (04/07 0000) Antibody: Negative (04/07 0000) Rubella:   Immune RPR: Nonreactive (04/07 0000)  HBsAg: Negative (04/07 0000)  HIV: Non-reactive (04/07 0000)  GBS:  Negative Sickle cell/Hgb electrophoresis:  Normal Pap:  Normal GC:  Negative Chlamydia:  Negtive Genetic screenings:  Negative Glucola:  Normal Other:  None    Assessment IUP at 41.4wks Cat I FT Active Labor IUGR Patient Noncompliance Abnormal Antenatal Testing GBS Negative  [facility]: [facility] consult with Dr. Kathie RhodesS. Rivard Routine Labor and Delivery Orders per [facility] Protocol Continuous monitoring  Phillips ClimesMLY, Kavian Peters LYNNCNM, MSN 08/03/2014, 12:58 AM

## 2014-08-04 LAB — CBC
HCT: 33.6 % — ABNORMAL LOW (ref 36.0–46.0)
HEMOGLOBIN: 11.2 g/dL — AB (ref 12.0–15.0)
MCH: 30.4 pg (ref 26.0–34.0)
MCHC: 33.3 g/dL (ref 30.0–36.0)
MCV: 91.1 fL (ref 78.0–100.0)
Platelets: 254 10*3/uL (ref 150–400)
RBC: 3.69 MIL/uL — AB (ref 3.87–5.11)
RDW: 12.6 % (ref 11.5–15.5)
WBC: 10.7 10*3/uL — ABNORMAL HIGH (ref 4.0–10.5)

## 2014-08-04 NOTE — Discharge Summary (Signed)
Vaginal Delivery Discharge Summary  Darlene Shepard  DOB:    10/08/1985 MRN:    [id number] CSN:    [id number]  Date of admission:                  08/03/14  Date of discharge:                   08/05/14  Procedures this admission:   SVB  Date of Delivery: 08/03/14  Newborn Data:  Live born female  Birth Weight: 5 lb 11.4 oz (2591 g) APGAR: 8, 9  Home with mother. Name: Darlene Shepard   History of Present Illness:  Ms. Darlene Bastosntwanisha T Debruhl is a 28 y.o. female, G3P2011, who presents at 6068w4d weeks gestation. [facility] She was admitted onset of labor. Her pregnancy has been complicated by:  Patient Active Problem List   Diagnosis Date Noted  . Active labor at term 08/03/2014  . IUGR (intrauterine growth restriction) 08/03/2014  . Non-compliant pregnant patient 08/03/2014  . Abnormal antenatal test 08/03/2014  . SVD (spontaneous vaginal delivery) 08/03/2014  . Poor fetal growth, affecting management of mother, antepartum condition or complication 04/12/2014  . Other known or suspected fetal abnormality, not elsewhere classified, affecting management of mother, antepartum condition or complication 04/12/2014     Hospital Course:  Admitted 08/03/14 in active labor.  Induction had been recommended due to IUGR, but patient had continued to decline, and had not been seen in the office since 11/19, with MAU visit on 11/26.  Negative GBS. Progressed rapidly without augmentation. Utilized nothing for pain management.  Delivery was performed by Gerrit HeckJessica Emly, CNM, without complication. Patient and baby tolerated the procedure without difficulty, with no laceration noted. Infant status was stable and remained in room with mother.  Mother and infant then had an uncomplicated postpartum course, with breast feeding going well.  Baby is being evaluated further due to small head  size, so CMV testing is in process.  Mom's physical exam was WNL, and she was discharged home in stable condition. Contraception plan was undecided at the time of d/c.  She received adequate benefit from po pain medications, declined Rx meds.   Feeding:  breast  Contraception:  Undecided--patient advises she will research and decide.  Discharge hemoglobin:  HEMOGLOBIN  Date Value Ref Range Status  08/04/2014 11.2* 12.0 - 15.0 g/dL Final   HCT  Date Value Ref Range Status  08/04/2014 33.6* 36.0 - 46.0 % Final    Discharge Physical Exam:   General: alert Lochia: appropriate Uterine Fundus: firm Incision: Perineum clear DVT Evaluation: No evidence of DVT seen on physical exam. Negative Homan's sign.  Intrapartum Procedures: spontaneous vaginal delivery Postpartum Procedures: none Complications-Operative and Postpartum: none  Discharge Diagnoses: Term Pregnancy-delivered and IUGR  Discharge Information:  Activity:           pelvic rest Diet:                routine Medications: Ibuprofen OTC Condition:      stable Instructions:     Discharge to: home  Follow-up Information    Follow up with [facility] In 5 weeks.   Specialty:  Obstetrics and Gynecology   Why:  Call with any questions or concerns.   Contact information:   [address] Darlene Shepard [phone]       Vernis Cabacungan,  Elijah Phommachanh CNM 08/05/2014 8:12 AM

## 2014-08-04 NOTE — Progress Notes (Signed)
Subjective: Postpartum Day 1: Vaginal delivery, no laceration Patient up ad lib, reports no syncope or dizziness. Using only Motrin for pain. Feeding:  Breast Contraceptive plan:  Undecided  Baby with IUGR, being evaluated for CMV due to small head circumference, but doing well.  Objective: Vital signs in last 24 hours: Temp:  [98 F (36.7 C)-98.8 F (37.1 C)] 98 F (36.7 C) (12/03 0617) Pulse Rate:  [62-76] 62 (12/03 0617) Resp:  [18] 18 (12/02 1710) BP: (107-116)/(57-62) 107/57 mmHg (12/03 0617) SpO2:  [99 %-100 %] 99 % (12/03 0617)  Physical Exam:  General: alert Lochia: appropriate Uterine Fundus: firm Perineum: Perineum intact DVT Evaluation: No evidence of DVT seen on physical exam. Negative Homan's sign.    Recent Labs  08/03/14 0100 08/04/14 0540  HGB 11.3* 11.2*  HCT 33.5* 33.6*    Assessment/Plan: Status post vaginal delivery day 1. Stable Continue current care. Plan for discharge tomorrow    Nyra CapesLATHAM, VICKICNM 08/04/2014, 9:06 AM

## 2014-08-04 NOTE — Plan of Care (Signed)
Problem: Phase II Progression Outcomes Goal: Pain controlled on oral analgesia Outcome: Completed/Met Date Met:  08/04/14 Goal: Progress activity as tolerated unless otherwise ordered Outcome: Completed/Met Date Met:  08/04/14 Goal: Afebrile, VS remain stable Outcome: Completed/Met Date Met:  08/04/14

## 2014-08-04 NOTE — Plan of Care (Signed)
Problem: Discharge Progression Outcomes Goal: Tolerating diet Outcome: Completed/Met Date Met:  08/04/14     

## 2014-08-04 NOTE — Lactation Note (Signed)
This note was copied from the chart of Darlene Shepard. Lactation Consultation Note: Follow up visit with mom. She has baby latched to the breast when I went into room. Mom looks uncomfortable with her positioning but states she is fine. Encouraged to get in a comfortable position since she is going to be be in that position for a while. Mom reports her nipples are a little sore. Encouraged to mom to wait for wide open mouth and keep the baby close to the breast throughout the feeding. She is letting her slide to the tip of the nipple- afraid the baby can't breathe up close to the breast. Nipple looks slightly pinched when baby comes off the breast. Comfort gels given with instructions for use. Encouraged to rub EBM into nipples after nursing. No questions at present. TO call for assist prn   Patient Name: Darlene Shepard ZOXWR'UToday's Date: 08/04/2014 Reason for consult: Follow-up assessment   Maternal Data Formula Feeding for Exclusion: No Has patient been taught Hand Expression?: Yes Does the patient have breastfeeding experience prior to this delivery?: Yes  Feeding Feeding Type: Breast Fed Length of feed: 20 min  LATCH Score/Interventions Latch: Grasps breast easily, tongue down, lips flanged, rhythmical sucking.  Audible Swallowing: A few with stimulation  Type of Nipple: Everted at rest and after stimulation  Comfort (Breast/Nipple): Filling, red/small blisters or bruises, mild/mod discomfort  Problem noted: Mild/Moderate discomfort Interventions (Mild/moderate discomfort): Comfort gels;Hand expression  Hold (Positioning): No assistance needed to correctly position infant at breast.  LATCH Score: 8  Lactation Tools Discussed/Used     Consult Status Consult Status: Follow-up Date: 08/05/14 Follow-up type: In-patient    Pamelia HoitWeeks, Aedin Jeansonne D 08/04/2014, 3:03 PM

## 2014-08-05 NOTE — Discharge Instructions (Signed)

## 2014-08-05 NOTE — Plan of Care (Signed)
Problem: Discharge Progression Outcomes Goal: Activity appropriate for discharge plan Outcome: Completed/Met Date Met:  08/05/14 Goal: Pain controlled with appropriate interventions Outcome: Completed/Met Date Met:  08/05/14 Goal: Afebrile, VS remain stable at discharge Outcome: Completed/Met Date Met:  08/05/14 Goal: Remove staples per MD order Outcome: Completed/Met Date Met:  08/05/14 Goal: MMR given as ordered Outcome: Completed/Met Date Met:  08/05/14 Goal: Discharge plan in place and appropriate Outcome: Completed/Met Date Met:  08/05/14

## 2014-08-05 NOTE — Lactation Note (Signed)
This note was copied from the chart of Darlene Shepard. Lactation Consultation Note Mom states BF going well. Hopes to go home today. Plans on BF for 4 weeks then will pump and bottle feed baby after she goes back to work. Denies painful latches. Has own DEBP at home. Has no questions. Reminded of OP services and support groups if needed. Reminded of importance of documenting I&O. Has occasionally forgotten to write some down.  Patient Name: Darlene Shepard ZOXWR'UToday's Date: 08/05/2014 Reason for consult: Follow-up assessment   Maternal Data    Feeding Feeding Type: Breast Fed Length of feed: 10 min  LATCH Score/Interventions                      Lactation Tools Discussed/Used     Consult Status Consult Status: Complete Date: 08/05/14 Follow-up type: Call as needed    [name], Diamond NickelLAURA G 08/05/2014, 8:49 AM

## 2014-12-30 IMAGING — US US OB FOLLOW-UP
1 series · 12 of 28 positions shown · non-contrast
Comparison: none

[Series 1: us ob follow-up · 0.23mm/px · 12 of 42 slices shown]
[im 2/42]
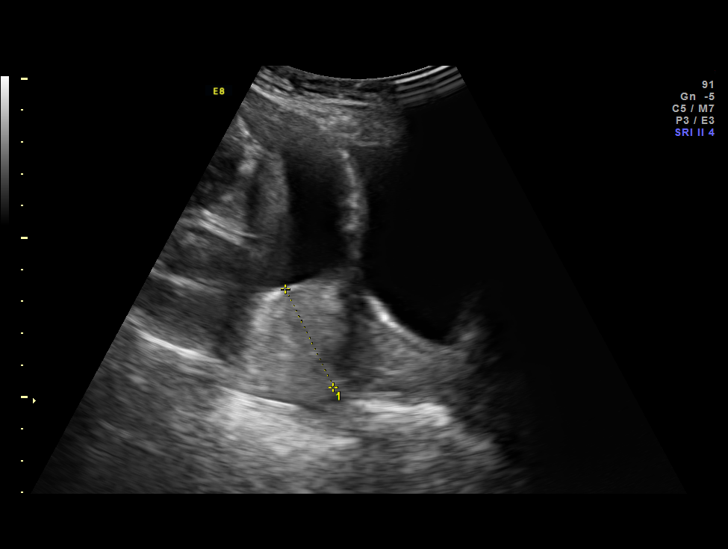
[im 5/42]
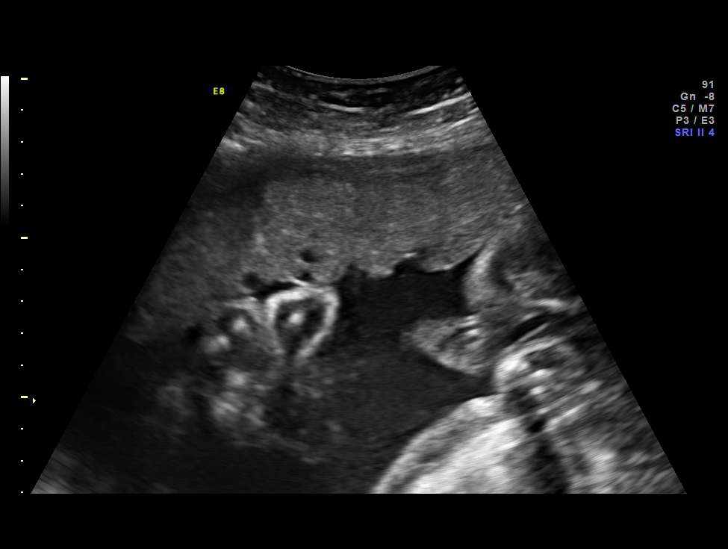
[im 8/42]
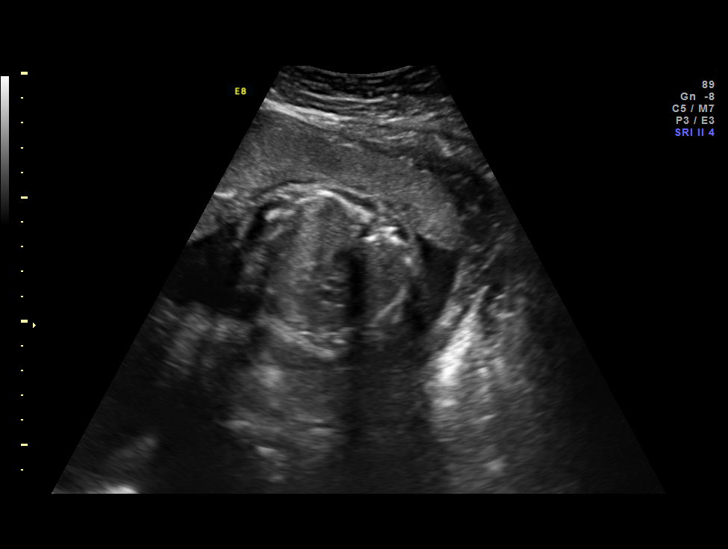
[im 13/42]
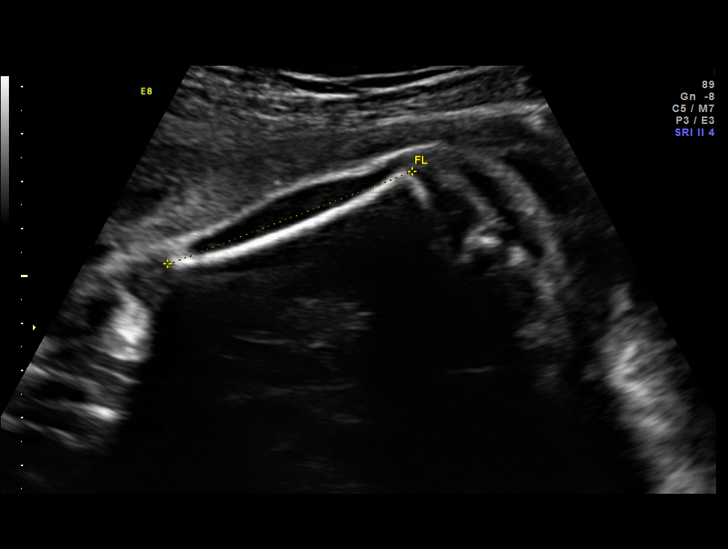
[im 16/42]
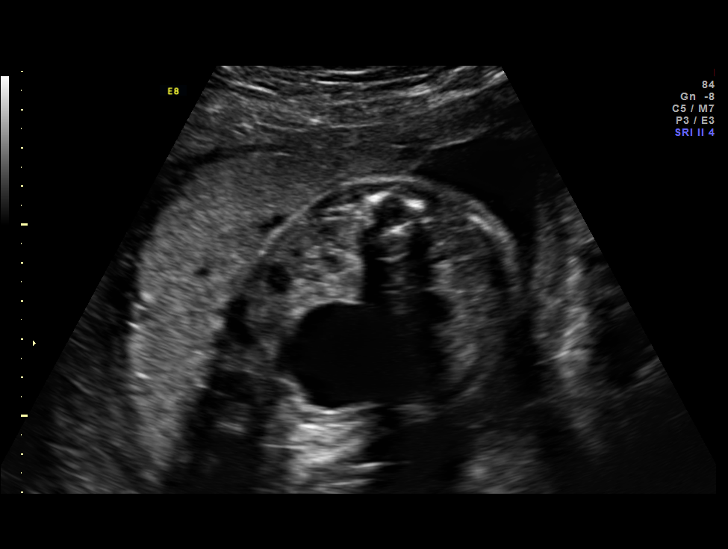
[im 19/42]
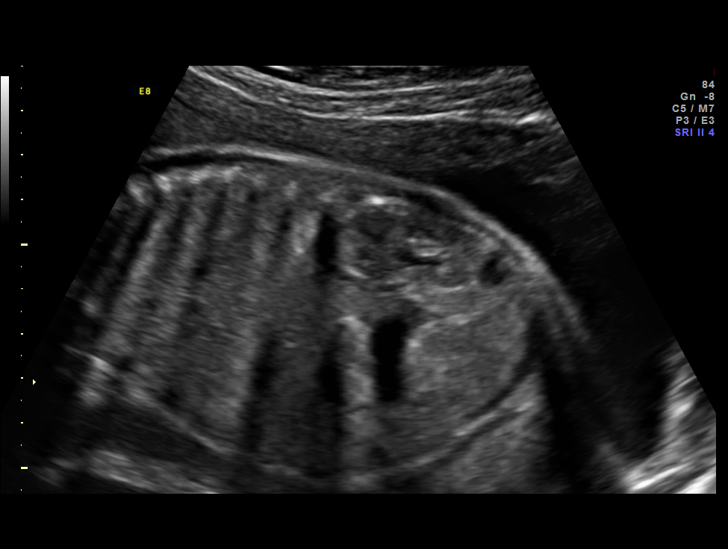
[im 23/42]
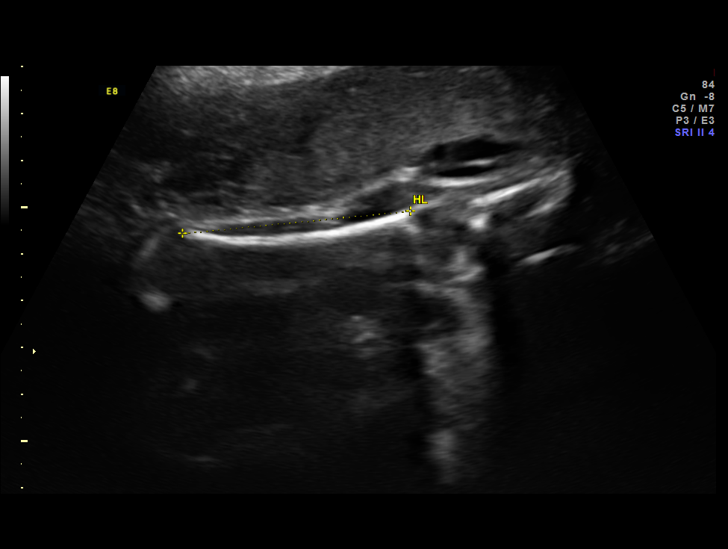
[im 26/42]
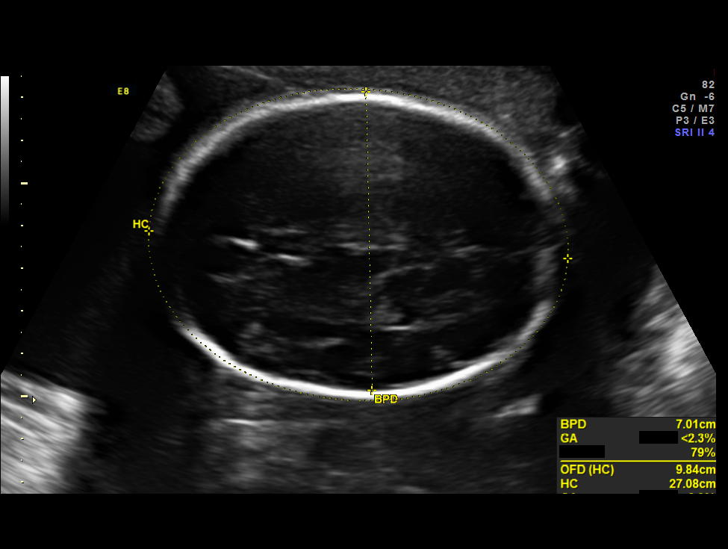
[im 29/42]
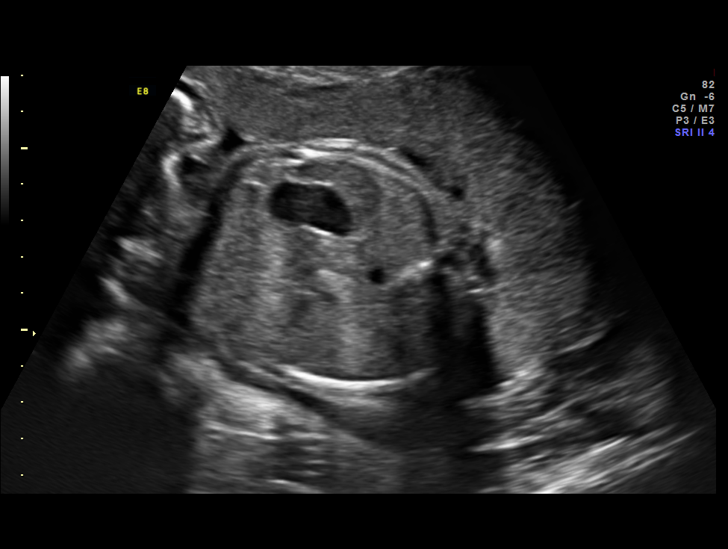
[im 34/42]
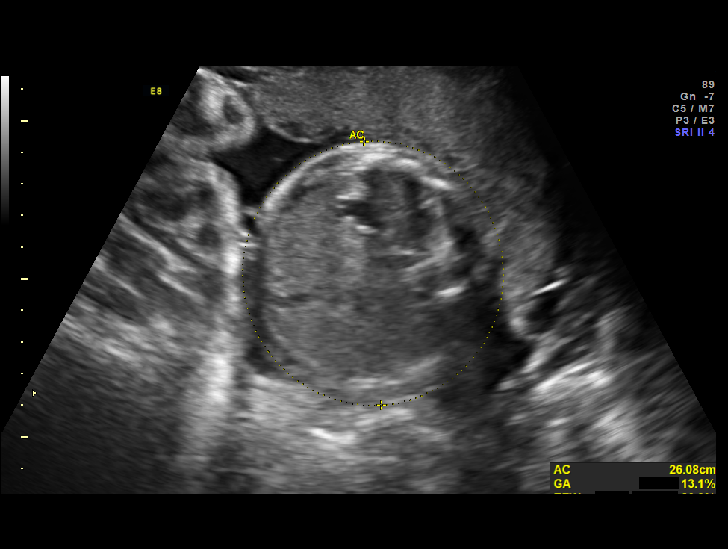
[im 37/42]
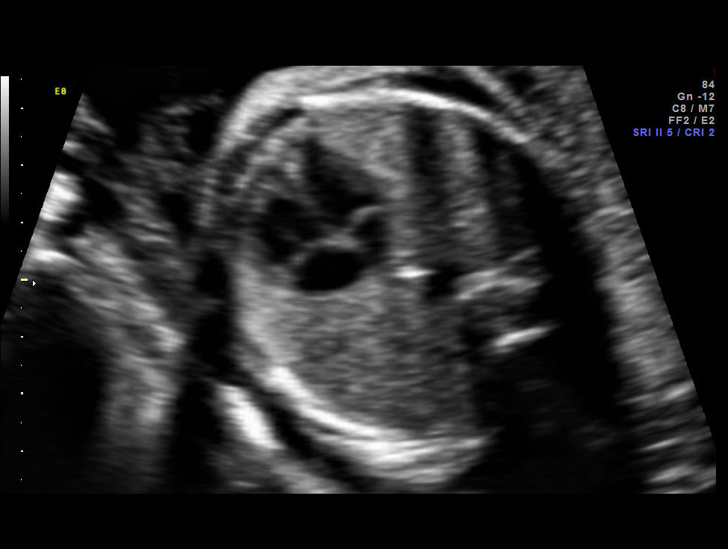
[im 40/42]
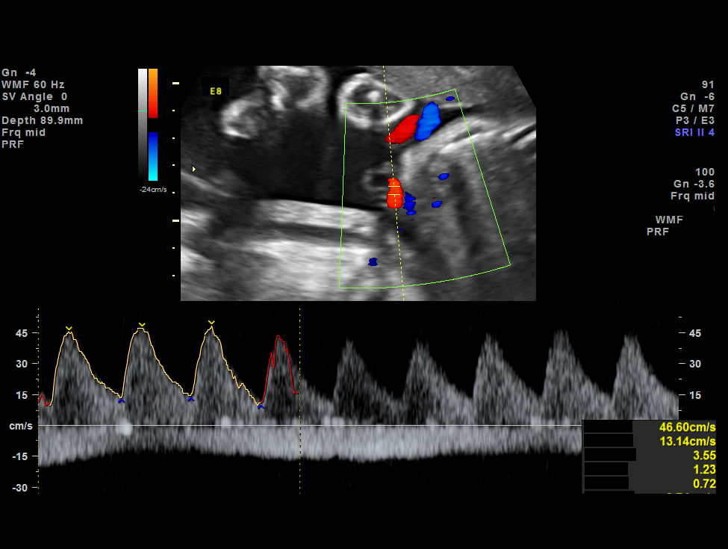

[12 of 28 positions shown; findings below may reference images not displayed]

OBSTETRICS REPORT
                      (Signed Final 05/25/2014 [DATE])

             SHEKH

Service(s) Provided

 US OB FOLLOW UP                                       76816.1
 US UA CORD DOPPLER                                    76820.0
Indications

 Size less than dates (Small for gestational [AGE]
 FGR)
 Pyelectasis of fetus on prenatal ultrasound           655.83 593.89,

Fetal Evaluation

 Num Of Fetuses:    1
 Fetal Heart Rate:  144                          bpm
 Cardiac Activity:  Observed
 Presentation:      Cephalic
 Placenta:          Anterior, above cervical os
 P. Cord            Previously Visualized
 Insertion:

 Amniotic Fluid
 AFI FV:      Subjectively within normal limits
 AFI Sum:     18.54   cm       69  %Tile     Larg Pckt:       8  cm
 RUQ:   2.74    cm   RLQ:    3.03   cm    LUQ:   4.77    cm   LLQ:    8      cm
Biophysical Evaluation

 Amniotic F.V:   Within normal limits       F. Tone:        Observed
 F. Movement:    Observed                   Score:          [DATE]
 F. Breathing:   Observed
Biometry

 BPD:     69.8  mm     G. Age:  28w 0d                CI:         71.3   70 - 86
 OFD:     97.9  mm                                    FL/HC:      20.4   19.1 -

 HC:     269.8  mm     G. Age:  29w 3d      < 3  %    HC/AC:      1.07   0.96 -

 AC:     252.8  mm     G. Age:  29w 3d        5  %    FL/BPD:     78.9   71 - 87
 FL:      55.1  mm     G. Age:  29w 0d      < 3  %    FL/AC:      21.8   20 - 24
 HUM:       49  mm     G. Age:  28w 6d      < 5  %

 Est. FW:    8499  gm           3 lb     16  %
Gestational Age

 LMP:           31w 4d        Date:  10/16/13                 EDD:   07/23/14
 U/S Today:     29w 0d                                        EDD:   08/10/14
 Best:          31w 4d     Det. By:  LMP  (10/16/13)          EDD:   07/23/14
Anatomy

 Cranium:          Appears normal         LVOT:             Previously seen
 Fetal Cavum:      Appears normal         Aortic Arch:      Previously seen
 Ventricles:       Appears normal         Ductal Arch:      Previously seen
 Choroid Plexus:   Previously seen        Diaphragm:        Appears normal
 Cerebellum:       Previously seen        Stomach:          Appears normal, left
                                                            sided
 Posterior Fossa:  Previously seen        Abdomen:          Previously seen
 Nuchal Fold:      Not applicable (>20    Abdominal Wall:   Previously seen
                   wks GA)
 Face:             Orbits and profile     Cord Vessels:     Previously seen
                   previously seen
 Lips:             Previously seen        Kidneys:          Appear normal
 Palate:           Previously seen        Spine:            Previously seen
 Heart:            Appears normal         Lower             Previously seen
                   (4CH, axis, and        Extremities:
                   situs)
 RVOT:             Previously seen        Upper             Previously seen
                                          Extremities:

 Other:  Parents do not wish to know sex of fetus. Heels and 5th digit
         previously seen. Nasal bone visualized.
Doppler - Fetal Vessels

 Umbilical Artery
 S/D:   2.98           62  %tile       RI:
 PI:    1.05                           PSV:       46.6    cm/s

Cervix Uterus Adnexa

 Cervix:       Not visualized (advanced GA >74wks)
Impression

 SIUP at 31+4 weeks
 Normal interval anatomy; anatomic survey complete; normal
 renal pelves
 Normal amniotic fluid volume
 EFW at the 16th %tile; AC at the 5th %tile
 UA dopplers were normal for this GA
 BPP [DATE]
Recommendations

 Begin twice weekly NSTs with weekly AFIs and UA dopplers
 Growth US in 3 weeks
 Deliver by 37 weeks

## 2016-05-16 ENCOUNTER — Ambulatory Visit
Admission: RE | Admit: 2016-05-16 | Discharge: 2016-05-16 | Disposition: A | Discharge status: Home / Self Care | Payer: 59 | Source: Ambulatory Visit | Attending: Family Medicine | Admitting: Family Medicine

## 2016-05-16 ENCOUNTER — Other Ambulatory Visit: Payer: Self-pay | Admitting: Family Medicine

## 2016-05-16 DIAGNOSIS — R059 Cough, unspecified: Secondary | ICD-10-CM

## 2016-05-16 DIAGNOSIS — R05 Cough: Secondary | ICD-10-CM

## 2018-12-08 ENCOUNTER — Other Ambulatory Visit: Payer: Self-pay | Admitting: Nurse Practitioner

## 2018-12-08 ENCOUNTER — Other Ambulatory Visit (HOSPITAL_COMMUNITY)
Admission: RE | Admit: 2018-12-08 | Discharge: 2018-12-08 | Disposition: A | Discharge status: Home / Self Care | Payer: BLUE CROSS/BLUE SHIELD | Source: Ambulatory Visit | Attending: Nurse Practitioner | Admitting: Nurse Practitioner

## 2018-12-08 DIAGNOSIS — R221 Localized swelling, mass and lump, neck: Secondary | ICD-10-CM

## 2018-12-08 DIAGNOSIS — Z01419 Encounter for gynecological examination (general) (routine) without abnormal findings: Secondary | ICD-10-CM | POA: Diagnosis present

## 2018-12-15 LAB — CYTOLOGY - PAP
Diagnosis: UNDETERMINED — AB
HPV: DETECTED — AB

## 2018-12-17 ENCOUNTER — Other Ambulatory Visit: Payer: Self-pay | Admitting: Nurse Practitioner

## 2018-12-17 DIAGNOSIS — E041 Nontoxic single thyroid nodule: Secondary | ICD-10-CM

## 2019-01-19 ENCOUNTER — Other Ambulatory Visit: Payer: BLUE CROSS/BLUE SHIELD

## 2019-02-05 ENCOUNTER — Other Ambulatory Visit: Payer: BLUE CROSS/BLUE SHIELD

## 2019-06-28 ENCOUNTER — Other Ambulatory Visit (HOSPITAL_COMMUNITY)
Admission: RE | Admit: 2019-06-28 | Discharge: 2019-06-28 | Disposition: A | Discharge status: Home / Self Care | Payer: BC Managed Care – PPO | Source: Ambulatory Visit | Attending: Nurse Practitioner | Admitting: Nurse Practitioner

## 2019-06-28 ENCOUNTER — Other Ambulatory Visit: Payer: Self-pay | Admitting: Nurse Practitioner

## 2019-06-28 DIAGNOSIS — Z124 Encounter for screening for malignant neoplasm of cervix: Secondary | ICD-10-CM | POA: Diagnosis not present

## 2019-07-01 LAB — CYTOLOGY - PAP
Comment: NEGATIVE
Diagnosis: NEGATIVE
High risk HPV: NEGATIVE

## 2019-10-18 ENCOUNTER — Emergency Department (HOSPITAL_COMMUNITY)
Admission: EM | Admit: 2019-10-18 | Discharge: 2019-10-18 | Disposition: A | Discharge status: Home / Self Care | Payer: BC Managed Care – PPO | Attending: Emergency Medicine | Admitting: Emergency Medicine

## 2019-10-18 ENCOUNTER — Encounter (HOSPITAL_COMMUNITY): Payer: Self-pay

## 2019-10-18 ENCOUNTER — Other Ambulatory Visit: Payer: Self-pay

## 2019-10-18 DIAGNOSIS — Z87891 Personal history of nicotine dependence: Secondary | ICD-10-CM | POA: Diagnosis not present

## 2019-10-18 DIAGNOSIS — Z79899 Other long term (current) drug therapy: Secondary | ICD-10-CM | POA: Diagnosis not present

## 2019-10-18 DIAGNOSIS — R220 Localized swelling, mass and lump, head: Secondary | ICD-10-CM

## 2019-10-18 DIAGNOSIS — K047 Periapical abscess without sinus: Secondary | ICD-10-CM | POA: Insufficient documentation

## 2019-10-18 MED ORDER — AMOXICILLIN-POT CLAVULANATE 875-125 MG PO TABS: 1.0000 | ORAL_TABLET | Freq: Two times a day (BID) | ORAL | 0 refills | Status: AC

## 2019-10-18 NOTE — ED Triage Notes (Signed)
Patient states she has dental pain in the right lower jaw. Patient states she has a dental appointment this afternoon, but has had increased swelling. Patient c/o increased pain with swallowing.

## 2019-10-18 NOTE — Discharge Instructions (Addendum)
Please attend your dentist appointment today.  I have prescribed a short course of antibiotics to help with the facial swelling. Please take 1 tablet twice a day for the next 7 days.

## 2019-10-18 NOTE — ED Provider Notes (Signed)
[facility] DEPT Provider Note   CSN: [id number] Arrival date & time: 10/18/19  0935     History Chief Complaint  Patient presents with  . Dental Pain  . Facial Swelling    Darlene Shepard is a 34 y.o. female.  34 y.o female with a PMH of Anemia, resents to the ED with a chief complaint of facial swelling since yesterday.  Patient reports she had an appointment scheduled with a dentist in order to have her wisdom teeth removed, states this appointment has been moved to today.  There is significant swelling to the frontal aspect of her mouth.  She does report pain with eating and drinking.  She has been tolerating liquids along with solids.  She has been taking Tylenol for pain without improvement in symptoms.  Denies any fever, vomiting, difficulty tolerating her secretions, shortness of breath.  The history is provided by the patient and medical records.       Past Medical History:  Diagnosis Date  . Anemia     Patient Active Problem List   Diagnosis Date Noted  . Active labor at term 08/03/2014  . IUGR (intrauterine growth restriction) 08/03/2014  . Non-compliant pregnant patient 08/03/2014  . Abnormal antenatal test 08/03/2014  . SVD (spontaneous vaginal delivery) 08/03/2014  . Poor fetal growth, affecting management of mother, antepartum condition or complication 58/05/9832  . Other known or suspected fetal abnormality, not elsewhere classified, affecting management of mother, antepartum condition or complication 82/50/5397    Past Surgical History:  Procedure Laterality Date  . LEEP       OB History    Gravida  3   Para  2   Term  2   Preterm  0   AB  1   Living  1     SAB  1   TAB  0   Ectopic  0   Multiple  0   Live Births  1           Family History  Problem Relation Age of Onset  . Hypertension Father   . Healthy Mother     Social History   Tobacco Use  . Smoking status: Former Research scientist (life sciences)  .  Smokeless tobacco: Never Used  Substance Use Topics  . Alcohol use: No  . Drug use: No    Home Medications Prior to Admission medications   Medication Sig Start Date End Date Taking? Authorizing Provider  amoxicillin-clavulanate (AUGMENTIN) 875-125 MG tablet Take 1 tablet by mouth 2 (two) times daily for 7 days. 10/18/19 10/25/19  Janeece Fitting, PA-C  calcium carbonate (TUMS - DOSED IN MG ELEMENTAL CALCIUM) 500 MG chewable tablet Chew 1-4 tablets by mouth daily as needed for heartburn.    [provider]  Prenatal Vit-Fe Fumarate-FA (PRENATAL MULTIVITAMIN) TABS tablet Take 1 tablet by mouth daily at 12 noon.    [provider]    Allergies    Patient has no known allergies.  Review of Systems   Review of Systems  Constitutional: Negative for fever.  HENT: Positive for dental problem and facial swelling. Negative for trouble swallowing.   Respiratory: Negative for shortness of breath.     Physical Exam Updated Vital Signs BP 121/81 (BP Location: Right Arm)   Pulse 87   Temp 98.9 F (37.2 C) (Oral)   Resp 14   Ht 5' 2.5" (1.588 m)   Wt 63 kg   LMP 10/18/2019   SpO2 100%   BMI 25.02  kg/m   Physical Exam Vitals and nursing note reviewed.  Constitutional:      Appearance: Normal appearance.  HENT:     Head: Normocephalic.      Comments: Facial swelling noted, palpable indurated area. No erythema surrounding.     Mouth/Throat:     Lips: Pink.     Mouth: Mucous membranes are moist.     Dentition: Dental tenderness, dental caries and dental abscesses present.     Pharynx: Oropharynx is clear.     Comments: Dental abscess noted to frontal aspect of her mouth, no trismus, no cervical adenopathy.  Eyes:     Pupils: Pupils are equal, round, and reactive to light.  Cardiovascular:     Rate and Rhythm: Normal rate.  Pulmonary:     Effort: Pulmonary effort is normal.  Abdominal:     General: Abdomen is flat.  Musculoskeletal:     Cervical back: Normal  range of motion and neck supple.  Skin:    General: Skin is warm and dry.  Neurological:     Mental Status: She is alert and oriented to person, place, and time.     ED Results / Procedures / Treatments   Labs (all labs ordered are listed, but only abnormal results are displayed) Labs Reviewed - No data to display  EKG None  Radiology No results found.  Procedures Procedures (including critical care time)  Medications Ordered in ED Medications - No data to display  ED Course  I have reviewed the triage vital signs and the nursing notes.  Pertinent labs & imaging results that were available during my care of the patient were reviewed by me and considered in my medical decision making (see chart for details).    MDM Rules/Calculators/A&P   Patient with no PMH presents to the ED with a chief complaint of facial swelling since yesterday.  Patient originally had an appointment with the dentist scheduled for Wednesday however was able to move this up to today after ED visit.  She reports she has had swelling to the right side of her face which has worsened in nature.  She has not been taking any medication aside from pain control.  She is tolerating liquids along with solids, no difficulty swallowing, no fevers.  During my evaluation patient is overall, nontoxic, nonill appearing.  This have significant swelling to the frontal aspect of the face.  Vitals are within normal limits, she has not been running any fevers.  No trismus, PTA noted.  We will provide her with a short course of antibiotics to help treat her infection.  She also has an appointment with her dentist today.  Advised to keep this appointment along with obtain further follow-up.  Patient does agrees to management, return precautions discussed at length.  Portions of this note were generated with Scientist, clinical (histocompatibility and immunogenetics). Dictation errors may occur despite best attempts at proofreading.  Final Clinical Impression(s) /  ED Diagnoses Final diagnoses:  Dental abscess  Facial swelling    Rx / DC Orders ED Discharge Orders         Ordered    amoxicillin-clavulanate (AUGMENTIN) 875-125 MG tablet  2 times daily     10/18/19 [address] PA-C 10/18/19 1035    Cathren Laine, MD 10/19/19 1441
